# Patient Record
Sex: Male | Born: 1980 | Hispanic: Yes
Health system: Southern US, Community
[De-identification: ages and names within clinical notes are randomized; demographics above are authoritative.]

---

## 2020-12-20 ENCOUNTER — Emergency Department (HOSPITAL_COMMUNITY): Payer: No Typology Code available for payment source

## 2020-12-20 ENCOUNTER — Emergency Department (HOSPITAL_COMMUNITY)
Admission: EM | Admit: 2020-12-20 | Discharge: 2020-12-20 | Disposition: A | Discharge status: Home / Self Care | Payer: No Typology Code available for payment source | Attending: Emergency Medicine | Admitting: Emergency Medicine

## 2020-12-20 DIAGNOSIS — S299XXA Unspecified injury of thorax, initial encounter: Secondary | ICD-10-CM | POA: Diagnosis present

## 2020-12-20 DIAGNOSIS — S20219A Contusion of unspecified front wall of thorax, initial encounter: Secondary | ICD-10-CM | POA: Diagnosis not present

## 2020-12-20 DIAGNOSIS — Y92410 Unspecified street and highway as the place of occurrence of the external cause: Secondary | ICD-10-CM | POA: Diagnosis not present

## 2020-12-20 DIAGNOSIS — S301XXA Contusion of abdominal wall, initial encounter: Secondary | ICD-10-CM

## 2020-12-20 LAB — CBC WITH DIFFERENTIAL/PLATELET
Abs Immature Granulocytes: 0.08 10*3/uL — ABNORMAL HIGH (ref 0.00–0.07)
Basophils Absolute: 0.1 10*3/uL (ref 0.0–0.1)
Basophils Relative: 0 %
Eosinophils Absolute: 0.1 10*3/uL (ref 0.0–0.5)
Eosinophils Relative: 1 %
HCT: 46.3 % (ref 39.0–52.0)
Hemoglobin: 16.2 g/dL (ref 13.0–17.0)
Immature Granulocytes: 1 %
Lymphocytes Relative: 11 %
Lymphs Abs: 1.8 10*3/uL (ref 0.7–4.0)
MCH: 29.2 pg (ref 26.0–34.0)
MCHC: 35 g/dL (ref 30.0–36.0)
MCV: 83.6 fL (ref 80.0–100.0)
Monocytes Absolute: 1.5 10*3/uL — ABNORMAL HIGH (ref 0.1–1.0)
Monocytes Relative: 9 %
Neutro Abs: 12.2 10*3/uL — ABNORMAL HIGH (ref 1.7–7.7)
Neutrophils Relative %: 78 %
Platelets: 309 10*3/uL (ref 150–400)
RBC: 5.54 MIL/uL (ref 4.22–5.81)
RDW: 12.6 % (ref 11.5–15.5)
WBC: 15.7 10*3/uL — ABNORMAL HIGH (ref 4.0–10.5)
nRBC: 0 % (ref 0.0–0.2)

## 2020-12-20 LAB — COMPREHENSIVE METABOLIC PANEL
ALT: 55 U/L — ABNORMAL HIGH (ref 0–44)
AST: 36 U/L (ref 15–41)
Albumin: 4 g/dL (ref 3.5–5.0)
Alkaline Phosphatase: 66 U/L (ref 38–126)
Anion gap: 10 (ref 5–15)
BUN: 13 mg/dL (ref 6–20)
CO2: 24 mmol/L (ref 22–32)
Calcium: 9 mg/dL (ref 8.9–10.3)
Chloride: 105 mmol/L (ref 98–111)
Creatinine, Ser: 0.7 mg/dL (ref 0.61–1.24)
GFR, Estimated: >60 mL/min (ref >60)
Glucose, Bld: 104 mg/dL — ABNORMAL HIGH (ref 70–99)
Potassium: 4.1 mmol/L (ref 3.5–5.1)
Sodium: 139 mmol/L (ref 135–145)
Total Bilirubin: 1 mg/dL (ref 0.3–1.2)
Total Protein: 7.1 g/dL (ref 6.5–8.1)

## 2020-12-20 MED ORDER — METHOCARBAMOL 500 MG PO TABS: 500.0000 mg | ORAL_TABLET | Freq: Four times a day (QID) | ORAL | 0 refills | Status: AC

## 2020-12-20 MED ORDER — IBUPROFEN 800 MG PO TABS: 800.0000 mg | ORAL_TABLET | Freq: Three times a day (TID) | ORAL | 0 refills | Status: AC | PRN

## 2020-12-20 MED ORDER — IOHEXOL 300 MG/ML  SOLN
100.0000 mL | Freq: Once | INTRAMUSCULAR | Status: AC | PRN
  Administered 2020-12-20: 100 mL via INTRAVENOUS

## 2020-12-20 NOTE — Discharge Instructions (Signed)
Return if any problems.
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
